# Patient Record
Sex: Male | Born: 1980 | Race: White | Hispanic: No | Marital: Single | Smoking: Former smoker
Health system: Southern US, Community
[De-identification: ages and names within clinical notes are randomized; demographics above are authoritative.]

## PROBLEM LIST (undated history)

## (undated) DIAGNOSIS — J45909 Unspecified asthma, uncomplicated: Secondary | ICD-10-CM

## (undated) DIAGNOSIS — J302 Other seasonal allergic rhinitis: Secondary | ICD-10-CM

## (undated) HISTORY — DX: Other seasonal allergic rhinitis: J30.2

## (undated) HISTORY — PX: HERNIA REPAIR: SHX51

## (undated) HISTORY — DX: Unspecified asthma, uncomplicated: J45.909

---

## 2014-05-17 ENCOUNTER — Ambulatory Visit (INDEPENDENT_AMBULATORY_CARE_PROVIDER_SITE_OTHER)
Admission: RE | Admit: 2014-05-17 | Discharge: 2014-05-17 | Disposition: A | Discharge status: Home / Self Care | Payer: BC Managed Care – PPO | Source: Ambulatory Visit | Attending: Internal Medicine | Admitting: Internal Medicine

## 2014-05-17 ENCOUNTER — Encounter: Payer: Self-pay | Admitting: Internal Medicine

## 2014-05-17 ENCOUNTER — Ambulatory Visit (INDEPENDENT_AMBULATORY_CARE_PROVIDER_SITE_OTHER): Payer: BC Managed Care – PPO | Admitting: Internal Medicine

## 2014-05-17 VITALS — BP 118/80 | HR 117 | Temp 98.7°F | Ht 68.0 in | Wt 177.8 lb

## 2014-05-17 DIAGNOSIS — J45991 Cough variant asthma: Secondary | ICD-10-CM

## 2014-05-17 DIAGNOSIS — J189 Pneumonia, unspecified organism: Secondary | ICD-10-CM | POA: Insufficient documentation

## 2014-05-17 MED ORDER — TRAMADOL HCL 50 MG PO TABS: ORAL_TABLET | ORAL | Status: DC

## 2014-05-17 MED ORDER — MOMETASONE FURO-FORMOTEROL FUM 100-5 MCG/ACT IN AERO: INHALATION_SPRAY | RESPIRATORY_TRACT | Status: DC

## 2014-05-17 NOTE — Patient Instructions (Addendum)
Dulera 100 Take 2 puffs first thing in am and then another 2 puffs about 12 hours later.    Only use your albuterol (ventolin) as a rescue medication to be used if you can't catch your breath by resting or doing a relaxed purse lip breathing pattern.  - The less you use it, the better it will work when you need it. - Ok to use up to 2 puffs  every 4 hours if you must but call for immediate appointment if use goes up over your usual need - Don't leave home without it !!  (think of it like the spare tire for your car)   The key to effective treatment for your cough is eliminating the non-stop cycle of cough you're stuck in long enough to let your airway heal completely and then see if there is anything still making you cough once you stop the cough suppression, but this should take no more than 5 days to figure out  First take delsym two tsp every 12 hours and supplement if needed with  tramadol 50 mg up to 1-2 every 4 hours to suppress the urge to cough at all or even clear your throat. Swallowing water or using ice chips/non mint and menthol containing candies (such as lifesavers or sugarless jolly ranchers) are also effective.  You should rest your voice and avoid activities that you know make you cough.  Once you have eliminated the cough for 3 straight days try reducing the tramadol first,  then the delsym as tolerated.    Try prilosec 20mg   Take 30-60 min before first meal of the day and Pepcid 20 mg one bedtime until cough is completely gone for at least a week without the need for cough suppression    GERD (REFLUX)  is an extremely common cause of respiratory symptoms, many times with no significant heartburn at all.    It can be treated with medication, but also with lifestyle changes including avoidance of late meals, excessive alcohol, smoking cessation, and avoid fatty foods, chocolate, peppermint, colas, red wine, and acidic juices such as orange juice.  NO MINT OR MENTHOL PRODUCTS SO  NO COUGH DROPS  USE HARD CANDY INSTEAD (jolley ranchers or Stover's or Lifesavers (all available in sugarless versions) NO OIL BASED VITAMINS - use powdered substitutes.  Please remember to go to the x-ray department downstairs for your tests - we will call you with the results when they are available.  Please schedule a follow up office visit in 2  weeks, sooner if needed

## 2014-05-17 NOTE — Progress Notes (Signed)
Subjective:     Patient ID: Ruben Oneill, male   DOB: 10/03/1980  MRN: [id number]  HPI   [age] yowm "born little" with seasonal allergies all his life spnng > fall rx with otc (saw allergist = dust/mold/pecan/cats)  needed inhalers maybe as early as pre elementary school with flares with colds maybe twice a year then by HS resolved and no needed inhaler then smoker mid teens until age 33 then quit with some breathing problems 100% resolved then acutely ill late Nov 2015 with ? Sinus infection and "flunked spirometer" then early Dec onset of dry cough/sob dx as "touch of pna"  But not back to baseline so referred by Ruben Oneill RT to pulmonary clinic 05/17/2014    05/17/2014 1st  Pulmonary office visit/ Ruben Oneill   Chief Complaint  Patient presents with  . Pulmonary Consult    Pneumonia since 05-05-14,saw [facility]) on Levaquin with reaction,Augmentin,Z-pak, and then Rocephin,steroid,cough-yellow,sob with exertion,wheezing,denies cp or tightness  last abx one day prior to OV  = zmax Last prednisone Dec 14/15 Last saba 715 am 2 h prior to OV  And does help some though hfa technique poor    No obvious day to day or daytime variabilty or assoc chronic cough or cp or chest tightness, subjective wheeze overt sinus or hb symptoms. No unusual exp hx or h/o childhood pna/ asthma or knowledge of premature birth.  Sleeping ok without nocturnal  or early am exacerbation  of respiratory  c/o's or need for noct saba. Also denies any obvious fluctuation of symptoms with weather or environmental changes or other aggravating or alleviating factors except as outlined above   Current Medications, Allergies, Complete Past Medical History, Past Surgical History, Family History, and Social History were reviewed in [facility] electronic medical record.  ROS  The following are not active complaints unless bolded sore throat, dysphagia, dental problems, itching, sneezing,  nasal congestion or excess/  purulent secretions, ear ache,   fever, chills, sweats, unintended wt loss, pleuritic or exertional cp, hemoptysis,  orthopnea pnd or leg swelling, presyncope, palpitations, heartburn, abdominal pain, anorexia, nausea, vomiting, diarrhea  or change in bowel or urinary habits, change in stools or urine, dysuria,hematuria,  rash, arthralgias, visual complaints, headache, numbness weakness or ataxia or problems with walking or coordination,  change in mood/affect or memory.        Review of Systems     Objective:   Physical Exam   amb hoarse wm nad  Wt Readings from Last 3 Encounters:  05/17/14 177 lb 12.8 oz (80.65 kg)    Vital signs reviewed   HEENT: nl dentition, turbinates, and orophanx. Nl external ear canals without cough reflex   NECK :  without JVD/Nodes/TM/ nl carotid upstrokes bilaterally   LUNGS: no acc muscle use, insp squeaks and pops both bases no wheeze   CV:  RRR  no s3 or murmur or increase in P2, no edema   ABD:  soft and nontender with nl excursion in the supine position. No bruits or organomegaly, bowel sounds nl  MS:  warm without deformities, calf tenderness, cyanosis or clubbing  SKIN: warm and dry without lesions    NEURO:  alert, approp, no deficits      CXR PA and Lateral:   05/17/2014 :  Abnormal ovoid nodular opacity laterally in the left upper lobe. This could represent infiltrate related to pneumonia       Assessment:

## 2014-05-17 NOTE — Assessment & Plan Note (Signed)
Most likely he did have a mild bronchopneumonia > should not need any further abx at this point but does need f/u cxr after the holidays/ advised

## 2014-05-17 NOTE — Assessment & Plan Note (Addendum)
The most common causes of chronic cough in immunocompetent adults include the following: upper airway cough syndrome (UACS), previously referred to as postnasal drip syndrome (PNDS), which is caused by variety of rhinosinus conditions; (2) asthma; (3) GERD; (4) chronic bronchitis from cigarette smoking or other inhaled environmental irritants; (5) nonasthmatic eosinophilic bronchitis; and (6) bronchiectasis.   These conditions, singly or in combination, have accounted for up to 94% of the causes of chronic cough in prospective studies.   Other conditions have constituted no >6% of the causes in prospective studies These have included bronchogenic carcinoma, chronic interstitial pneumonia, sarcoidosis, left ventricular failure, ACEI-induced cough, and aspiration from a condition associated with pharyngeal dysfunction.    Chronic cough is often simultaneously caused by more than one condition. A single cause has been found from 38 to 82% of the time, multiple causes from 18 to 62%. Multiply caused cough has been the result of three diseases up to 42% of the time.       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 Improved effectiveness after extensive coaching during this visit to a level of approximately  90% from a baseline of < 25%

## 2014-05-18 ENCOUNTER — Institutional Professional Consult (permissible substitution): Payer: Self-pay | Admitting: Internal Medicine

## 2014-05-18 NOTE — Progress Notes (Signed)
Quick Note:  Spoke with pt and notified of results per Dr. Wert. Pt verbalized understanding and denied any questions.  ______ 

## 2014-05-18 NOTE — Progress Notes (Signed)
Quick Note:  LMTCB ______ 

## 2014-05-31 ENCOUNTER — Ambulatory Visit: Payer: Self-pay | Admitting: Internal Medicine

## 2014-06-07 ENCOUNTER — Encounter: Payer: Self-pay | Admitting: Internal Medicine

## 2014-06-07 ENCOUNTER — Ambulatory Visit (INDEPENDENT_AMBULATORY_CARE_PROVIDER_SITE_OTHER)
Admission: RE | Admit: 2014-06-07 | Discharge: 2014-06-07 | Disposition: A | Discharge status: Home / Self Care | Payer: BLUE CROSS/BLUE SHIELD | Source: Ambulatory Visit | Attending: Internal Medicine | Admitting: Internal Medicine

## 2014-06-07 ENCOUNTER — Ambulatory Visit (INDEPENDENT_AMBULATORY_CARE_PROVIDER_SITE_OTHER): Payer: BLUE CROSS/BLUE SHIELD | Admitting: Internal Medicine

## 2014-06-07 VITALS — BP 118/68 | HR 76 | Temp 98.2°F | Ht 68.0 in | Wt 179.0 lb

## 2014-06-07 DIAGNOSIS — J189 Pneumonia, unspecified organism: Secondary | ICD-10-CM

## 2014-06-07 DIAGNOSIS — J45991 Cough variant asthma: Secondary | ICD-10-CM

## 2014-06-07 NOTE — Patient Instructions (Signed)
Add pepcid ac 20 mg x one  to chlortrimeton 4 mg 1 or 2 at bedtime to see if eliminates the drainage  For daytime drainage ok to  take chlortrimeton (chlorpheniramine) 4 mg every 4 hours available over the counter (may cause drowsiness)   Try off dulera to see what difference it makes > call us for prescription   Send me a copy of the disc from [facility] and I will call you about recommendations for CT or not

## 2014-06-07 NOTE — Progress Notes (Signed)
Subjective:     Patient ID: Ruben Oneill, male   DOB: Oct 18, 1980  MRN: [id number]     Brief patient profile:  33 yowm Natalie's (RT)  friend from [place]  "born little" with seasonal allergies all his life spnng > fall rx with otc (saw allergist = dust/mold/pecan/cats)  needed inhalers maybe as early as pre elementary school with flares with colds maybe twice a year then by HS resolved and no needed inhaler then smoker mid teens until age 8 then quit with some breathing problems 100% resolved then acutely ill late Nov 2015 with ? Sinus infection and "flunked spirometer" then early Dec onset of dry cough/sob dx as "touch of pna"  But not back to baseline so referred by Dorene Grebe RT to pulmonary clinic 05/17/2014    History of Present Illness  05/17/2014 1st [place] [facility] office visit/ Ruben Oneill   Chief Complaint  Patient presents with  . Pulmonary Consult    Pneumonia since 05-05-14,saw [facility]) on Levaquin with reaction,Augmentin,Z-pak, and then Rocephin,steroid,cough-yellow,sob with exertion,wheezing,denies cp or tightness  last abx one day prior to OV  = zmax Last prednisone Dec 14/15 Last saba 715 am 2 h prior to OV  And does help some though hfa technique poor  rec Dulera 100 Take 2 puffs first thing in am and then another 2 puffs about 12 hours later.  Only use your albuterol (ventolin)  As rescue  First take delsym two tsp every 12 hours and supplement if needed with  tramadol 50 mg up to 1-2 every 4 hours to suppress the urge to cough at all or even clear your throat. Swallowing water or using ice chips/non mint and menthol containing candies (such as lifesavers or sugarless jolly ranchers) are also effective.  You should rest your voice and avoid activities that you know make you cough. Once you have eliminated the cough for 3 straight days try reducing the tramadol first,  then the delsym as tolerated.   Try prilosec   Take 30-60 min before first meal of the day  and Pepcid 20 mg one bedtime until cough is completely gone for at least a week without the need for cough suppression gerd diet              06/07/2014 f/u ov/Ruben Oneill re: asthma/ abn cxr   On dulera 1002bid and no need for saba  Chief Complaint  Patient presents with  . Follow-up    Pt states cough is improved but still present, worse in mornings but feels like this is more related to PND.       Not limited by breathing from desired activities     No obvious day to day or daytime variabilty or assoc  Excess or purulent sputum or cp or chest tightness, subjective wheeze overt sinus or hb symptoms. No unusual exp hx or h/o childhood pna/ asthma or knowledge of premature birth.  Sleeping ok without nocturnal  or early am exacerbation  of respiratory  c/o's or need for noct saba. Also denies any obvious fluctuation of symptoms with weather or environmental changes or other aggravating or alleviating factors except as outlined above   Current Medications, Allergies, Complete Past Medical History, Past Surgical History, Family History, and Social History were reviewed in [facility] record.  ROS  The following are not active complaints unless bolded sore throat, dysphagia, dental problems, itching, sneezing,  nasal congestion or excess/ purulent secretions, ear ache,   fever, chills, sweats, unintended wt loss,  pleuritic or exertional cp, hemoptysis,  orthopnea pnd or leg swelling, presyncope, palpitations, heartburn, abdominal pain, anorexia, nausea, vomiting, diarrhea  or change in bowel or urinary habits, change in stools or urine, dysuria,hematuria,  rash, arthralgias, visual complaints, headache, numbness weakness or ataxia or problems with walking or coordination,  change in mood/affect or memory.              Objective:   Physical Exam   amb hoarse wm nad  Wt Readings from Last 3 Encounters:  06/07/14 179 lb (81.194 kg)  05/17/14 177 lb 12.8 oz (80.65 kg)     Vital signs reviewed        HEENT: nl dentition, turbinates, and orophanx. Nl external ear canals without cough reflex   NECK :  without JVD/Nodes/TM/ nl carotid upstrokes bilaterally   LUNGS: no acc muscle use, very min squeaks and pops both bases no wheeze   CV:  RRR  no s3 or murmur or increase in P2, no edema   ABD:  soft and nontender with nl excursion in the supine position. No bruits or organomegaly, bowel sounds nl  MS:  warm without deformities, calf tenderness, cyanosis or clubbing  SKIN: warm and dry without lesions    NEURO:  alert, approp, no deficits      CXR PA and Lateral:   05/17/2014 :     I personally reviewed images and agree with radiology impression as follows:    Abnormal ovoid nodular opacity laterally in the left upper lobe. This could represent infiltrate related to pneumonia       Assessment:

## 2014-06-10 ENCOUNTER — Encounter: Payer: Self-pay | Admitting: Internal Medicine

## 2014-06-11 NOTE — Assessment & Plan Note (Addendum)
-   05/17/2014 p extensive coaching HFA effectiveness =    90% > try dulera 100 2bid   Not clear he really has asthma, main problem now is pnds > try adding h1 h2 hs and off dulera, back on it though if cough worsens

## 2014-06-11 NOTE — Assessment & Plan Note (Signed)
-   see cxr 05/17/2014 vague left upper nodular changes not present on cxr 05/06/14 report only   Need all xrays obtained to date for apples to apples comparison but probably headed for ct head

## 2014-06-20 ENCOUNTER — Telehealth: Payer: Self-pay | Admitting: Internal Medicine

## 2014-06-20 DIAGNOSIS — J45991 Cough variant asthma: Secondary | ICD-10-CM

## 2014-06-20 MED ORDER — MOMETASONE FURO-FORMOTEROL FUM 100-5 MCG/ACT IN AERO: INHALATION_SPRAY | RESPIRATORY_TRACT | Status: AC

## 2014-06-20 NOTE — Telephone Encounter (Signed)
Spoke with pt, states he needs dulera rx sent in.  This has been done.  Also states he is sending his cxr disk with Dorene Grebeatalie to the office.  She will bring this to MW.  Nothing further needed.

## 2014-06-20 NOTE — Telephone Encounter (Signed)
[phone] pt calling back

## 2014-06-20 NOTE — Telephone Encounter (Signed)
lmtcb X1 for pt  

## 2015-06-25 IMAGING — CR DG CHEST 2V
2 series · 2 of 2 positions shown · non-contrast
Comparison: 05/17/2014.

CLINICAL DATA: Pneumonia.  Asthma.

EXAM:
CHEST  2 VIEW

[view not recorded (1 of 2)]
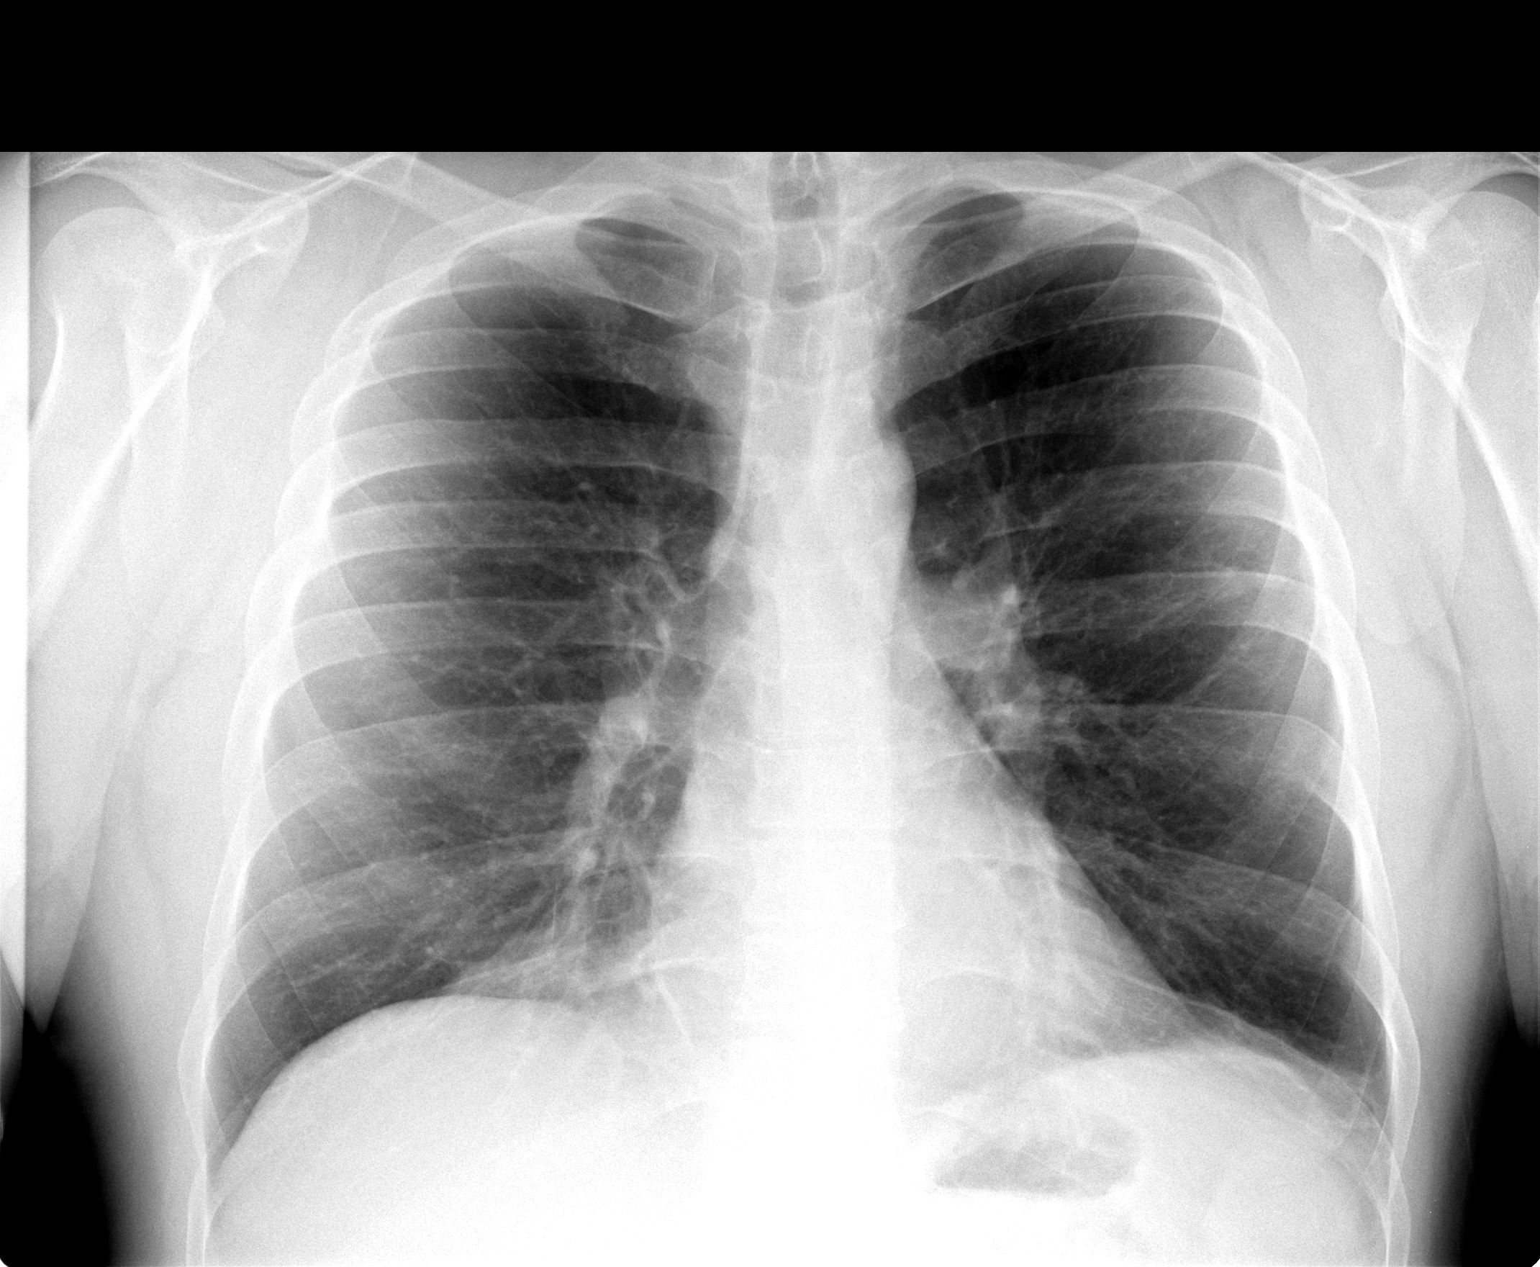

[view not recorded (2 of 2)]
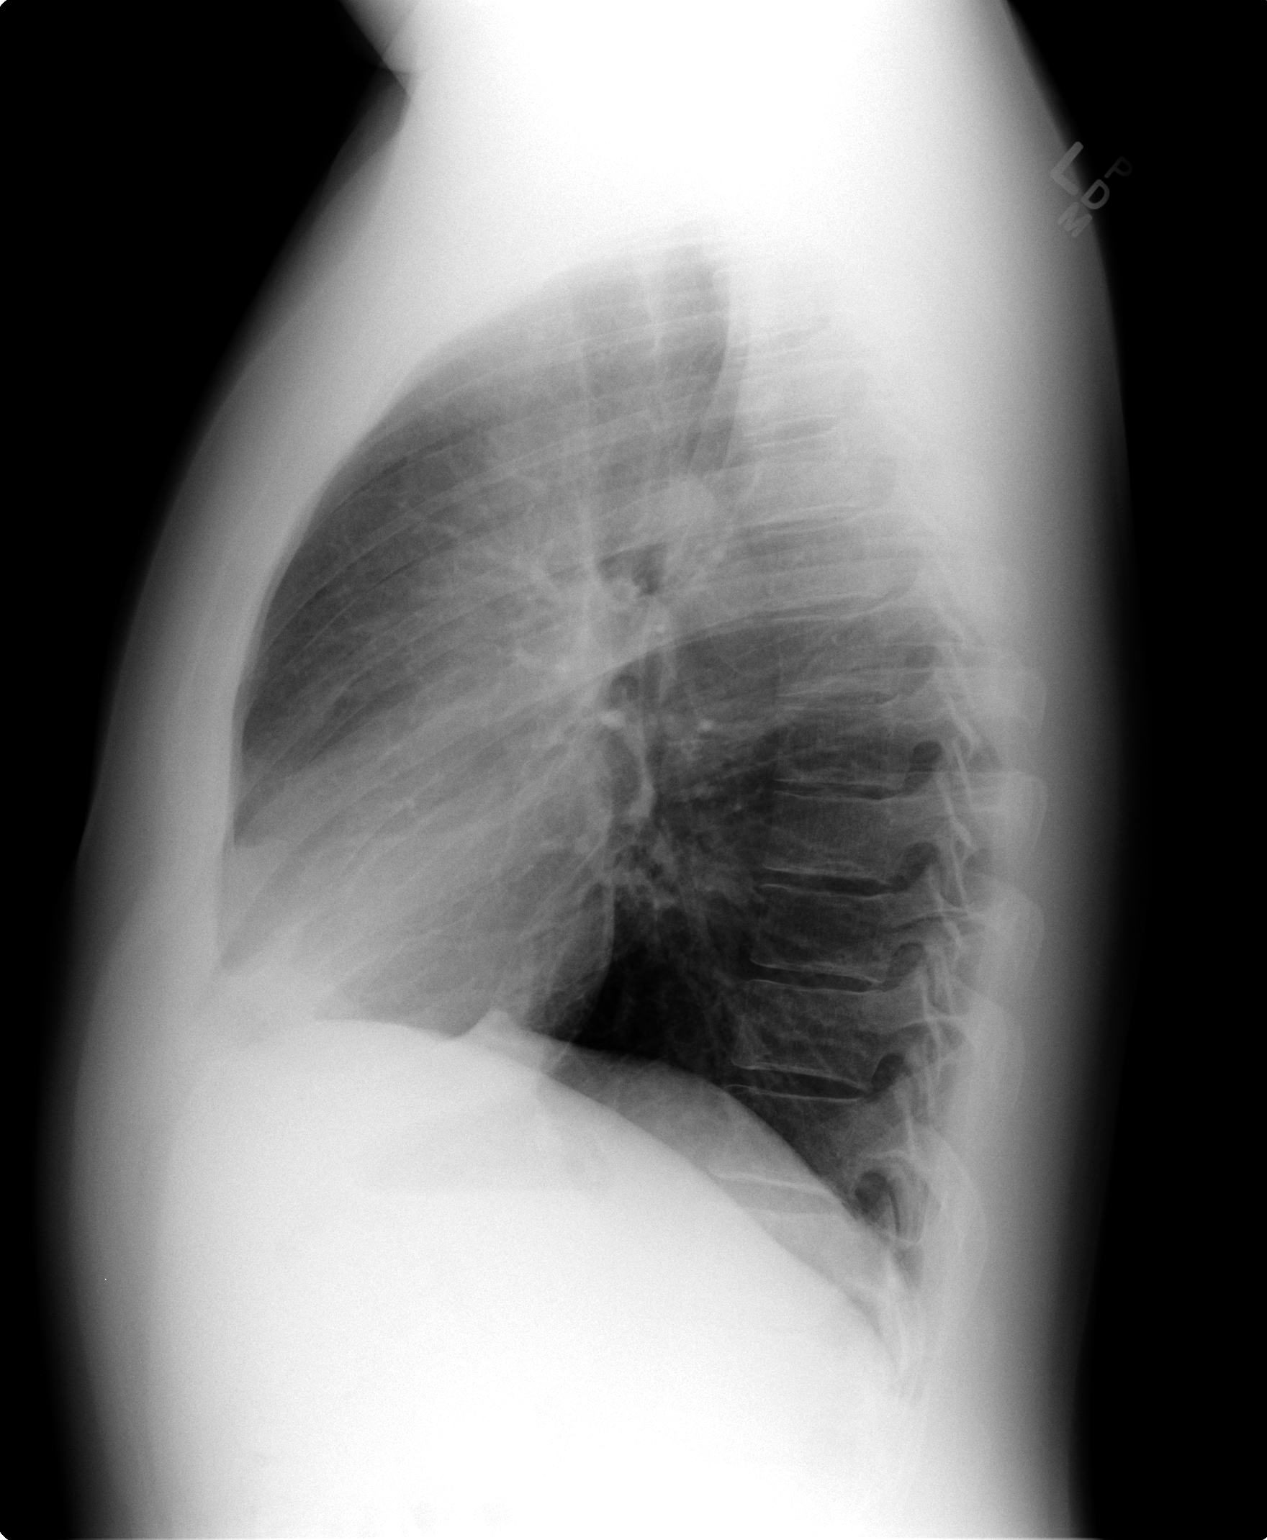

[2 of 2 positions shown; findings below may reference images not displayed]

FINDINGS: Mediastinum and hilar structures are normal. Persistent nodular
infiltrate left upper lobe. New infiltrate right lung base.
Continued follow-up chest x-rays recommended to demonstrate
clearing. If the findings do not clear, chest CT would be needed to
further evaluate. Heart size normal. No acute bony abnormality.
IMPRESSION: Persistent nodular infiltrate left upper lobe and new infiltrate in
the right lung base. Continued follow-up chest x-rays recommended to
demonstrate clearing.
